# Patient Record
Sex: Male | Born: 1960
Health system: Southern US, Community
[De-identification: ages and names within clinical notes are randomized; demographics above are authoritative.]

---

## 2013-05-08 ENCOUNTER — Encounter (HOSPITAL_COMMUNITY): Payer: Self-pay | Admitting: *Deleted

## 2013-05-08 ENCOUNTER — Emergency Department (HOSPITAL_COMMUNITY)
Admission: EM | Admit: 2013-05-08 | Discharge: 2013-05-09 | Disposition: A | Discharge status: Home / Self Care | Payer: BC Managed Care – PPO | Attending: Emergency Medicine | Admitting: Emergency Medicine

## 2013-05-08 DIAGNOSIS — R071 Chest pain on breathing: Secondary | ICD-10-CM | POA: Insufficient documentation

## 2013-05-08 DIAGNOSIS — R0789 Other chest pain: Secondary | ICD-10-CM

## 2013-05-08 LAB — POCT I-STAT, CHEM 8
BUN: 17 mg/dL (ref 6–23)
Chloride: 104 mEq/L (ref 96–112)
Creatinine, Ser: 1.1 mg/dL (ref 0.50–1.35)
Potassium: 3.4 mEq/L — ABNORMAL LOW (ref 3.5–5.1)
Sodium: 142 mEq/L (ref 135–145)

## 2013-05-08 LAB — CBC
Hemoglobin: 15 g/dL (ref 13.0–17.0)
MCH: 30.5 pg (ref 26.0–34.0)
RBC: 4.91 MIL/uL (ref 4.22–5.81)
WBC: 8.9 10*3/uL (ref 4.0–10.5)

## 2013-05-08 LAB — POCT I-STAT TROPONIN I: Troponin i, poc: 0 ng/mL (ref 0.00–0.08)

## 2013-05-08 NOTE — ED Notes (Addendum)
Chest tightness since this morning while at work, non  Radiating. Reproducible on deep breathing.

## 2013-05-09 ENCOUNTER — Emergency Department (HOSPITAL_COMMUNITY): Payer: BC Managed Care – PPO

## 2013-05-09 LAB — POCT I-STAT TROPONIN I: Troponin i, poc: 0 ng/mL (ref 0.00–0.08)

## 2013-05-09 LAB — D-DIMER, QUANTITATIVE: D-Dimer, Quant: <0.27 ug/mL-FEU (ref 0.00–0.48)

## 2013-05-09 MED ORDER — OXYCODONE-ACETAMINOPHEN 5-325 MG PO TABS
1.0000 | ORAL_TABLET | Freq: Once | ORAL | Status: AC
  Administered 2013-05-09: 1 via ORAL
  Filled 2013-05-09: qty 1

## 2013-05-09 MED ORDER — NAPROXEN 250 MG PO TABS: 250.0000 mg | ORAL_TABLET | Freq: Two times a day (BID) | ORAL | Status: AC

## 2013-05-09 MED ORDER — IBUPROFEN 200 MG PO TABS
400.0000 mg | ORAL_TABLET | Freq: Once | ORAL | Status: AC
  Administered 2013-05-09: 400 mg via ORAL
  Filled 2013-05-09: qty 2

## 2013-05-09 MED ORDER — HYDROCODONE-ACETAMINOPHEN 5-325 MG PO TABS: ORAL_TABLET | ORAL | Status: AC

## 2013-05-09 NOTE — ED Notes (Signed)
Pt transported to XR.  

## 2013-05-09 NOTE — ED Provider Notes (Signed)
History    CSN: [id number] Arrival date & time 05/08/13  2046  First MD Initiated Contact with Patient 05/09/13 0034     Chief Complaint  Patient presents with  . Chest Pain    HPI Pt was seen at 0050.   Per pt, c/o gradual onset and persistence of constant generalized chest "pain" since yesterday morning at 0500.  Describes the pain as "tightness," worsens with deep breathing. States the pain has been constant since its onset. Denies palpitations, no SOB/cough, no abd pain, no N/V/D, no back pain.    History reviewed. No pertinent past medical history.  History reviewed. No pertinent past surgical history.  History  Substance Use Topics  . Smoking status: Never Smoker   . Smokeless tobacco: Not on file  . Alcohol Use: No    Review of Systems ROS: Statement: All systems negative except as marked or noted in the HPI; Constitutional: Negative for fever and chills. ; ; Eyes: Negative for eye pain, redness and discharge. ; ; ENMT: Negative for ear pain, hoarseness, nasal congestion, sinus pressure and sore throat. ; ; Cardiovascular: +CP. Negative for palpitations, diaphoresis, dyspnea and peripheral edema. ; ; Respiratory: Negative for cough, wheezing and stridor. ; ; Gastrointestinal: Negative for nausea, vomiting, diarrhea, abdominal pain, blood in stool, hematemesis, jaundice and rectal bleeding. . ; ; Genitourinary: Negative for dysuria, flank pain and hematuria. ; ; Musculoskeletal: Negative for back pain and neck pain. Negative for swelling and trauma.; ; Skin: Negative for pruritus, rash, abrasions, blisters, bruising and skin lesion.; ; Neuro: Negative for headache, lightheadedness and neck stiffness. Negative for weakness, altered level of consciousness , altered mental status, extremity weakness, paresthesias, involuntary movement, seizure and syncope.       Allergies  Review of patient's allergies indicates no known allergies.  Home Medications  No current outpatient  prescriptions on file. BP 125/86  Pulse 55  Temp(Src) 98.1 F (36.7 C) (Oral)  Resp 17  SpO2 95%  Physical Exam 0055: Physical examination:  Nursing notes reviewed; Vital signs and O2 SAT reviewed;  Constitutional: Well developed, Well nourished, Well hydrated, In no acute distress; Head:  Normocephalic, atraumatic; Eyes: EOMI, PERRL, No scleral icterus; ENMT: Mouth and pharynx normal, Mucous membranes moist; Neck: Supple, Full range of motion, No lymphadenopathy; Cardiovascular: Regular rate and rhythm, No murmur, rub, or gallop; Respiratory: Breath sounds clear & equal bilaterally, No rales, rhonchi, wheezes.  Speaking full sentences with ease, Normal respiratory effort/excursion; Chest: Nontender, Movement normal; Abdomen: Soft, Nontender, Nondistended, Normal bowel sounds; Genitourinary: No CVA tenderness; Extremities: Pulses normal, No tenderness, No edema, No calf edema or asymmetry.; Neuro: AA&Ox3, Major CN grossly intact.  Speech clear. No gross focal motor or sensory deficits in extremities.; Skin: Color normal, Warm, Dry.   ED Course  Procedures     MDM  MDM Reviewed: nursing note and vitals Interpretation: labs, ECG and x-ray    Date: 05/09/2013  Rate: 63  Rhythm: normal sinus rhythm  QRS Axis: normal  Intervals: normal  ST/T Wave abnormalities: normal  Conduction Disutrbances:none  Narrative Interpretation:   Old EKG Reviewed: none available.  Results for orders placed during the hospital encounter of 05/08/13  CBC      Result Value Range   WBC 8.9  4.0 - 10.5 K/uL   RBC 4.91  4.22 - 5.81 MIL/uL   Hemoglobin 15.0  13.0 - 17.0 g/dL   HCT 13.0  86.5 - 78.4 %   MCV 83.3  78.0 - 100.0 fL  MCH 30.5  26.0 - 34.0 pg   MCHC 36.7 (*) 30.0 - 36.0 g/dL   RDW 04.5  40.9 - 81.1 %   Platelets 239  150 - 400 K/uL  D-DIMER, QUANTITATIVE      Result Value Range   D-Dimer, Quant <0.27  0.00 - 0.48 ug/mL-FEU  POCT I-STAT, CHEM 8      Result Value Range   Sodium 142  135 -  145 mEq/L   Potassium 3.4 (*) 3.5 - 5.1 mEq/L   Chloride 104  96 - 112 mEq/L   BUN 17  6 - 23 mg/dL   Creatinine, Ser 9.14  0.50 - 1.35 mg/dL   Glucose, Bld 782 (*) 70 - 99 mg/dL   Calcium, Ion 9.56  2.13 - 1.23 mmol/L   TCO2 25  0 - 100 mmol/L   Hemoglobin 14.3  13.0 - 17.0 g/dL   HCT 08.6  57.8 - 46.9 %  POCT I-STAT TROPONIN I      Result Value Range   Troponin i, poc 0.00  0.00 - 0.08 ng/mL   Comment 3           POCT I-STAT TROPONIN I      Result Value Range   Troponin i, poc 0.00  0.00 - 0.08 ng/mL   Comment 3            Dg Chest 2 View 05/09/2013   *RADIOLOGY REPORT*  Clinical Data: Chest tightness.  Chest pain.  CHEST - 2 VIEW  Comparison: None.  Findings:  Cardiopericardial silhouette within normal limits. Mediastinal contours normal. Trachea midline.  No airspace disease or effusion. Monitoring leads are projected over the chest. No pneumothorax.  IMPRESSION: No active cardiopulmonary disease.   Original Report Authenticated By: Andreas Newport, M.D.    Audio.Aas:  Doubt PE as cause for symptoms with normal d-dimer and low risk [place].  Doubt ACS as cause for symptoms with 2 normal troponins and normal EKG after 20+ hours of constant symptoms, TIMI 0.  Pt states he feels improved after pain meds and wants to go home now. Dx and testing d/w pt and family.  Questions answered.  Verb understanding, agreeable to d/c home with outpt f/u.           Laray Anger, DO 05/11/13 2036

## 2016-06-21 DIAGNOSIS — L255 Unspecified contact dermatitis due to plants, except food: Secondary | ICD-10-CM | POA: Diagnosis not present

## 2016-08-05 DIAGNOSIS — M25552 Pain in left hip: Secondary | ICD-10-CM | POA: Diagnosis not present

## 2016-08-05 DIAGNOSIS — F329 Major depressive disorder, single episode, unspecified: Secondary | ICD-10-CM | POA: Diagnosis not present

## 2016-09-25 DIAGNOSIS — D225 Melanocytic nevi of trunk: Secondary | ICD-10-CM | POA: Diagnosis not present

## 2016-09-25 DIAGNOSIS — L814 Other melanin hyperpigmentation: Secondary | ICD-10-CM | POA: Diagnosis not present

## 2016-09-25 DIAGNOSIS — L820 Inflamed seborrheic keratosis: Secondary | ICD-10-CM | POA: Diagnosis not present

## 2016-09-25 DIAGNOSIS — L821 Other seborrheic keratosis: Secondary | ICD-10-CM | POA: Diagnosis not present

## 2016-10-16 DIAGNOSIS — H01113 Allergic dermatitis of right eye, unspecified eyelid: Secondary | ICD-10-CM | POA: Diagnosis not present

## 2016-11-04 DIAGNOSIS — F321 Major depressive disorder, single episode, moderate: Secondary | ICD-10-CM | POA: Diagnosis not present

## 2016-11-04 DIAGNOSIS — F329 Major depressive disorder, single episode, unspecified: Secondary | ICD-10-CM | POA: Diagnosis not present

## 2016-11-04 DIAGNOSIS — G2581 Restless legs syndrome: Secondary | ICD-10-CM | POA: Diagnosis not present

## 2017-03-23 DIAGNOSIS — L821 Other seborrheic keratosis: Secondary | ICD-10-CM | POA: Diagnosis not present

## 2017-03-23 DIAGNOSIS — L814 Other melanin hyperpigmentation: Secondary | ICD-10-CM | POA: Diagnosis not present

## 2017-03-23 DIAGNOSIS — L255 Unspecified contact dermatitis due to plants, except food: Secondary | ICD-10-CM | POA: Diagnosis not present

## 2017-03-23 DIAGNOSIS — D225 Melanocytic nevi of trunk: Secondary | ICD-10-CM | POA: Diagnosis not present

## 2017-03-23 DIAGNOSIS — L820 Inflamed seborrheic keratosis: Secondary | ICD-10-CM | POA: Diagnosis not present

## 2017-07-09 DIAGNOSIS — G2581 Restless legs syndrome: Secondary | ICD-10-CM | POA: Diagnosis not present

## 2017-07-09 DIAGNOSIS — F329 Major depressive disorder, single episode, unspecified: Secondary | ICD-10-CM | POA: Diagnosis not present

## 2017-12-17 DIAGNOSIS — G2581 Restless legs syndrome: Secondary | ICD-10-CM | POA: Diagnosis not present

## 2017-12-17 DIAGNOSIS — F320 Major depressive disorder, single episode, mild: Secondary | ICD-10-CM | POA: Diagnosis not present

## 2018-05-02 DIAGNOSIS — L729 Follicular cyst of the skin and subcutaneous tissue, unspecified: Secondary | ICD-10-CM | POA: Diagnosis not present

## 2018-05-03 DIAGNOSIS — L720 Epidermal cyst: Secondary | ICD-10-CM | POA: Diagnosis not present

## 2018-05-03 DIAGNOSIS — R208 Other disturbances of skin sensation: Secondary | ICD-10-CM | POA: Diagnosis not present

## 2019-03-13 DIAGNOSIS — H524 Presbyopia: Secondary | ICD-10-CM | POA: Diagnosis not present

## 2019-04-20 DIAGNOSIS — E669 Obesity, unspecified: Secondary | ICD-10-CM | POA: Diagnosis not present

## 2019-04-20 DIAGNOSIS — F320 Major depressive disorder, single episode, mild: Secondary | ICD-10-CM | POA: Diagnosis not present

## 2019-04-20 DIAGNOSIS — G2581 Restless legs syndrome: Secondary | ICD-10-CM | POA: Diagnosis not present

## 2019-04-20 DIAGNOSIS — R0681 Apnea, not elsewhere classified: Secondary | ICD-10-CM | POA: Diagnosis not present

## 2019-05-24 DIAGNOSIS — G2581 Restless legs syndrome: Secondary | ICD-10-CM | POA: Diagnosis not present

## 2019-05-24 DIAGNOSIS — G4730 Sleep apnea, unspecified: Secondary | ICD-10-CM | POA: Diagnosis not present

## 2019-07-20 DIAGNOSIS — G4733 Obstructive sleep apnea (adult) (pediatric): Secondary | ICD-10-CM | POA: Diagnosis not present

## 2019-07-21 ENCOUNTER — Encounter (HOSPITAL_COMMUNITY): Payer: Self-pay

## 2019-07-21 ENCOUNTER — Ambulatory Visit (HOSPITAL_COMMUNITY)
Admission: EM | Admit: 2019-07-21 | Discharge: 2019-07-21 | Disposition: A | Discharge status: Home / Self Care | Payer: 59

## 2019-07-21 ENCOUNTER — Other Ambulatory Visit: Payer: Self-pay

## 2019-07-21 DIAGNOSIS — L237 Allergic contact dermatitis due to plants, except food: Secondary | ICD-10-CM | POA: Diagnosis not present

## 2019-07-21 MED ORDER — METHYLPREDNISOLONE SODIUM SUCC 125 MG IJ SOLR
80.0000 mg | Freq: Once | INTRAMUSCULAR | Status: AC
  Administered 2019-07-21: 80 mg via INTRAMUSCULAR

## 2019-07-21 MED ORDER — METHYLPREDNISOLONE SODIUM SUCC 125 MG IJ SOLR
INTRAMUSCULAR | Status: AC
Start: 2019-07-21 — End: ?
  Filled 2019-07-21: qty 2

## 2019-07-21 MED ORDER — PREDNISONE 10 MG (21) PO TBPK: ORAL_TABLET | ORAL | 0 refills | Status: DC

## 2019-07-21 NOTE — Discharge Instructions (Signed)
Start the prednisone pack tomorrow.  Take with food. Benadryl or zyrtec for itching.  Follow up as needed for continued or worsening symptoms

## 2019-07-21 NOTE — ED Triage Notes (Signed)
Patient presents to Urgent Care with complaints of poison oak rash since 6 days ago. Patient reports it started on his arms and has now spread to his whole body including his face, which is why he came in today. Prednisone has worked in the past, pt has been applying calamine lotion.

## 2019-07-21 NOTE — ED Provider Notes (Signed)
[facility]    CSN: [id number] Arrival date & time: 07/21/19  1300      History   Chief Complaint Chief Complaint  Patient presents with  . Poison Oak    HPI Samuel Aguilar is a 58 y.o. male.   Patient is a 58 year old male the presents today for rash.  The rash started approximately 5 or 6 days ago after doing some yard work and then exposure to poison ivy.  Since the rash is worsened and spreading on bilateral arms, chest and face.  He has been using, lotion.  The rash is somewhat painful and itchy.    Denies any fever, joint pain. Denies any recent changes in lotions, detergents, foods or other possible irritants. No recent travel. Nobody else at home has the rash.  No new foods or medications.   ROS per HPI        History reviewed. No pertinent past medical history.  There are no active problems to display for this patient.   History reviewed. No pertinent surgical history.     Home Medications    Prior to Admission medications   Medication Sig Start Date End Date Taking? Authorizing Provider  escitalopram (LEXAPRO) 20 MG tablet Take by mouth. 05/24/19  Yes [provider]  rOPINIRole (REQUIP) 0.25 MG tablet Take by mouth. 07/09/17 01/29/25 Yes [provider]  HYDROcodone-acetaminophen (NORCO/VICODIN) 5-325 MG per tablet 1 or 2 tabs PO q6 hours prn pain 05/09/13   Francine Graven, DO  naproxen (NAPROSYN) 250 MG tablet Take 1 tablet (250 mg total) by mouth 2 (two) times daily with a meal. 05/09/13   Francine Graven, DO  predniSONE (STERAPRED UNI-PAK 21 TAB) 10 MG (21) TBPK tablet 6 tabs for 1 day, then 5 tabs for 1 das, then 4 tabs for 1 day, then 3 tabs for 1 day, 2 tabs for 1 day, then 1 tab for 1 day 07/21/19   Orvan July, NP    Family History Family History  Problem Relation Age of Onset  . Healthy Mother   . Cancer Father   . Diabetes Father     Social History Social History   Tobacco Use  . Smoking status:  Never Smoker  . Smokeless tobacco: Never Used  Substance Use Topics  . Alcohol use: No  . Drug use: No     Allergies   Patient has no known allergies.   Review of Systems Review of Systems   Physical Exam Triage Vital Signs ED Triage Vitals  Enc Vitals Group     BP 07/21/19 1326 120/71     Pulse Rate 07/21/19 1326 71     Resp 07/21/19 1326 17     Temp 07/21/19 1326 98.3 F (36.8 C)     Temp Source 07/21/19 1326 Oral     SpO2 07/21/19 1326 99 %     Weight --      Height --      Head Circumference --      Peak Flow --      Pain Score 07/21/19 1324 0     Pain Loc --      Pain Edu? --      Excl. in [place]? --    No data found.  Updated Vital Signs BP 120/71 (BP Location: Right Arm)   Pulse 71   Temp 98.3 F (36.8 C) (Oral)   Resp 17   SpO2 99%   Visual Acuity Right Eye Distance:   Left  Eye Distance:   Bilateral Distance:    Right Eye Near:   Left Eye Near:    Bilateral Near:     Physical Exam Vitals signs and nursing note reviewed.  Constitutional:      Appearance: Normal appearance.  HENT:     Head: Normocephalic and atraumatic.     Nose: Nose normal.  Eyes:     Conjunctiva/sclera: Conjunctivae normal.  Neck:     Musculoskeletal: Normal range of motion.  Pulmonary:     Effort: Pulmonary effort is normal.  Musculoskeletal: Normal range of motion.  Skin:    General: Skin is warm and dry.     Findings: Rash present.     Comments: Widespread papulovesicular, erythematous rash to bilateral arms left chest and facial area Some crusting  Neurological:     Mental Status: He is alert.  Psychiatric:        Mood and Affect: Mood normal.      UC Treatments / Results  Labs (all labs ordered are listed, but only abnormal results are displayed) Labs Reviewed - No data to display  EKG   Radiology No results found.  Procedures Procedures (including critical care time)  Medications Ordered in UC Medications  methylPREDNISolone sodium succinate  (SOLU-MEDROL) 125 mg/2 mL injection 80 mg (80 mg Intramuscular Given 07/21/19 1351)  methylPREDNISolone sodium succinate (SOLU-MEDROL) 125 mg/2 mL injection (has no administration in time range)    Initial Impression / Assessment and Plan / UC Course  I have reviewed the triage vital signs and the nursing notes.  Pertinent labs & imaging results that were available during my care of the patient were reviewed by me and considered in my medical decision making (see chart for details).     Rash consistent with poison ivy dermatitis.  Treating with steroid injection here in clinic and sending 6-day taper to start tomorrow. Benadryl or Zyrtec as needed for itching Follow up as needed for continued or worsening symptoms  Final Clinical Impressions(s) / UC Diagnoses   Final diagnoses:  Poison ivy dermatitis     Discharge Instructions     Start the prednisone pack tomorrow.  Take with food. Benadryl or zyrtec for itching.  Follow up as needed for continued or worsening symptoms     ED Prescriptions    Medication Sig Dispense Auth. Provider   predniSONE (STERAPRED UNI-PAK 21 TAB) 10 MG (21) TBPK tablet 6 tabs for 1 day, then 5 tabs for 1 das, then 4 tabs for 1 day, then 3 tabs for 1 day, 2 tabs for 1 day, then 1 tab for 1 day 21 tablet Rozanna Box, Josiephine Simao A, NP     Controlled Substance Prescriptions [place] Controlled Substance Registry consulted? No   Orvan July, NP 07/21/19 1429

## 2019-07-25 DIAGNOSIS — L720 Epidermal cyst: Secondary | ICD-10-CM | POA: Diagnosis not present

## 2019-07-25 DIAGNOSIS — D485 Neoplasm of uncertain behavior of skin: Secondary | ICD-10-CM | POA: Diagnosis not present

## 2019-07-25 DIAGNOSIS — D225 Melanocytic nevi of trunk: Secondary | ICD-10-CM | POA: Diagnosis not present

## 2019-07-25 DIAGNOSIS — L249 Irritant contact dermatitis, unspecified cause: Secondary | ICD-10-CM | POA: Diagnosis not present

## 2019-07-25 DIAGNOSIS — L821 Other seborrheic keratosis: Secondary | ICD-10-CM | POA: Diagnosis not present

## 2019-07-25 DIAGNOSIS — D1801 Hemangioma of skin and subcutaneous tissue: Secondary | ICD-10-CM | POA: Diagnosis not present

## 2019-07-25 DIAGNOSIS — L820 Inflamed seborrheic keratosis: Secondary | ICD-10-CM | POA: Diagnosis not present

## 2019-08-11 DIAGNOSIS — G4733 Obstructive sleep apnea (adult) (pediatric): Secondary | ICD-10-CM | POA: Diagnosis not present

## 2019-09-11 DIAGNOSIS — G4733 Obstructive sleep apnea (adult) (pediatric): Secondary | ICD-10-CM | POA: Diagnosis not present

## 2019-09-12 DIAGNOSIS — L720 Epidermal cyst: Secondary | ICD-10-CM | POA: Diagnosis not present

## 2019-10-11 DIAGNOSIS — G4733 Obstructive sleep apnea (adult) (pediatric): Secondary | ICD-10-CM | POA: Diagnosis not present

## 2020-01-22 DIAGNOSIS — D485 Neoplasm of uncertain behavior of skin: Secondary | ICD-10-CM | POA: Diagnosis not present

## 2020-01-22 DIAGNOSIS — L820 Inflamed seborrheic keratosis: Secondary | ICD-10-CM | POA: Diagnosis not present

## 2020-01-22 DIAGNOSIS — D1801 Hemangioma of skin and subcutaneous tissue: Secondary | ICD-10-CM | POA: Diagnosis not present

## 2020-01-22 DIAGNOSIS — D225 Melanocytic nevi of trunk: Secondary | ICD-10-CM | POA: Diagnosis not present

## 2020-01-29 DIAGNOSIS — G43009 Migraine without aura, not intractable, without status migrainosus: Secondary | ICD-10-CM | POA: Diagnosis not present

## 2020-02-12 DIAGNOSIS — D225 Melanocytic nevi of trunk: Secondary | ICD-10-CM | POA: Diagnosis not present

## 2020-02-12 DIAGNOSIS — D485 Neoplasm of uncertain behavior of skin: Secondary | ICD-10-CM | POA: Diagnosis not present

## 2020-03-29 DIAGNOSIS — M47812 Spondylosis without myelopathy or radiculopathy, cervical region: Secondary | ICD-10-CM | POA: Diagnosis not present

## 2020-03-29 DIAGNOSIS — M542 Cervicalgia: Secondary | ICD-10-CM | POA: Diagnosis not present

## 2020-05-01 ENCOUNTER — Other Ambulatory Visit: Payer: Self-pay | Admitting: Physician Assistant

## 2020-05-01 DIAGNOSIS — M47812 Spondylosis without myelopathy or radiculopathy, cervical region: Secondary | ICD-10-CM | POA: Diagnosis not present

## 2020-05-01 DIAGNOSIS — N289 Disorder of kidney and ureter, unspecified: Secondary | ICD-10-CM | POA: Diagnosis not present

## 2020-05-01 DIAGNOSIS — Z0000 Encounter for general adult medical examination without abnormal findings: Secondary | ICD-10-CM | POA: Diagnosis not present

## 2020-05-02 DIAGNOSIS — M5382 Other specified dorsopathies, cervical region: Secondary | ICD-10-CM | POA: Diagnosis not present

## 2020-05-02 DIAGNOSIS — R29898 Other symptoms and signs involving the musculoskeletal system: Secondary | ICD-10-CM | POA: Diagnosis not present

## 2020-05-02 DIAGNOSIS — M47812 Spondylosis without myelopathy or radiculopathy, cervical region: Secondary | ICD-10-CM | POA: Diagnosis not present

## 2020-05-03 ENCOUNTER — Other Ambulatory Visit: Payer: Self-pay | Admitting: Physician Assistant

## 2020-05-06 DIAGNOSIS — R29898 Other symptoms and signs involving the musculoskeletal system: Secondary | ICD-10-CM | POA: Diagnosis not present

## 2020-05-06 DIAGNOSIS — M5382 Other specified dorsopathies, cervical region: Secondary | ICD-10-CM | POA: Diagnosis not present

## 2020-05-06 DIAGNOSIS — M47812 Spondylosis without myelopathy or radiculopathy, cervical region: Secondary | ICD-10-CM | POA: Diagnosis not present

## 2020-05-10 DIAGNOSIS — G4733 Obstructive sleep apnea (adult) (pediatric): Secondary | ICD-10-CM | POA: Diagnosis not present

## 2020-05-23 DIAGNOSIS — M47812 Spondylosis without myelopathy or radiculopathy, cervical region: Secondary | ICD-10-CM | POA: Diagnosis not present

## 2020-05-23 DIAGNOSIS — M5382 Other specified dorsopathies, cervical region: Secondary | ICD-10-CM | POA: Diagnosis not present

## 2020-05-23 DIAGNOSIS — R29898 Other symptoms and signs involving the musculoskeletal system: Secondary | ICD-10-CM | POA: Diagnosis not present

## 2020-05-28 ENCOUNTER — Other Ambulatory Visit: Payer: Self-pay | Admitting: Physician Assistant

## 2020-05-30 DIAGNOSIS — R29898 Other symptoms and signs involving the musculoskeletal system: Secondary | ICD-10-CM | POA: Diagnosis not present

## 2020-05-30 DIAGNOSIS — M5382 Other specified dorsopathies, cervical region: Secondary | ICD-10-CM | POA: Diagnosis not present

## 2020-05-30 DIAGNOSIS — M47812 Spondylosis without myelopathy or radiculopathy, cervical region: Secondary | ICD-10-CM | POA: Diagnosis not present

## 2020-07-18 DIAGNOSIS — Z20822 Contact with and (suspected) exposure to covid-19: Secondary | ICD-10-CM | POA: Diagnosis not present

## 2020-10-10 ENCOUNTER — Other Ambulatory Visit: Payer: Self-pay | Admitting: Physician Assistant

## 2020-10-19 DIAGNOSIS — Z20822 Contact with and (suspected) exposure to covid-19: Secondary | ICD-10-CM | POA: Diagnosis not present

## 2020-10-28 ENCOUNTER — Other Ambulatory Visit: Payer: Self-pay | Admitting: Physician Assistant

## 2021-02-04 ENCOUNTER — Other Ambulatory Visit: Payer: Self-pay

## 2021-02-18 ENCOUNTER — Other Ambulatory Visit: Payer: Self-pay

## 2021-02-18 MED FILL — Rosuvastatin Calcium Tab 10 MG: ORAL | 90 days supply | Qty: 90 | Fill #0 | Status: AC

## 2021-02-18 MED FILL — Escitalopram Oxalate Tab 20 MG (Base Equiv): ORAL | 90 days supply | Qty: 90 | Fill #0 | Status: AC

## 2021-02-19 ENCOUNTER — Other Ambulatory Visit: Payer: Self-pay

## 2021-02-20 ENCOUNTER — Other Ambulatory Visit: Payer: Self-pay

## 2021-02-21 ENCOUNTER — Other Ambulatory Visit: Payer: Self-pay

## 2021-02-24 ENCOUNTER — Other Ambulatory Visit: Payer: Self-pay

## 2021-02-24 MED ORDER — ROPINIROLE HCL 0.5 MG PO TABS
0.5000 mg | ORAL_TABLET | Freq: Every day | ORAL | 3 refills | Status: AC
  Filled 2021-02-24: qty 30, 30d supply, fill #0
  Filled 2021-03-26: qty 30, 30d supply, fill #1
  Filled 2021-04-28: qty 30, 30d supply, fill #2
  Filled 2021-05-25: qty 30, 30d supply, fill #3

## 2021-03-11 DIAGNOSIS — L814 Other melanin hyperpigmentation: Secondary | ICD-10-CM | POA: Diagnosis not present

## 2021-03-11 DIAGNOSIS — D1801 Hemangioma of skin and subcutaneous tissue: Secondary | ICD-10-CM | POA: Diagnosis not present

## 2021-03-11 DIAGNOSIS — D2262 Melanocytic nevi of left upper limb, including shoulder: Secondary | ICD-10-CM | POA: Diagnosis not present

## 2021-03-11 DIAGNOSIS — D2221 Melanocytic nevi of right ear and external auricular canal: Secondary | ICD-10-CM | POA: Diagnosis not present

## 2021-03-11 DIAGNOSIS — L821 Other seborrheic keratosis: Secondary | ICD-10-CM | POA: Diagnosis not present

## 2021-03-11 DIAGNOSIS — D229 Melanocytic nevi, unspecified: Secondary | ICD-10-CM | POA: Diagnosis not present

## 2021-03-11 DIAGNOSIS — D485 Neoplasm of uncertain behavior of skin: Secondary | ICD-10-CM | POA: Diagnosis not present

## 2021-03-11 DIAGNOSIS — L820 Inflamed seborrheic keratosis: Secondary | ICD-10-CM | POA: Diagnosis not present

## 2021-03-27 ENCOUNTER — Other Ambulatory Visit: Payer: Self-pay

## 2021-04-02 DIAGNOSIS — H524 Presbyopia: Secondary | ICD-10-CM | POA: Diagnosis not present

## 2021-04-28 ENCOUNTER — Other Ambulatory Visit: Payer: Self-pay

## 2021-04-29 ENCOUNTER — Other Ambulatory Visit: Payer: Self-pay

## 2021-05-02 ENCOUNTER — Other Ambulatory Visit: Payer: Self-pay

## 2021-05-14 ENCOUNTER — Other Ambulatory Visit: Payer: Self-pay

## 2021-05-16 ENCOUNTER — Other Ambulatory Visit: Payer: Self-pay

## 2021-05-19 ENCOUNTER — Other Ambulatory Visit: Payer: Self-pay

## 2021-05-19 MED ORDER — ESCITALOPRAM OXALATE 20 MG PO TABS
20.0000 mg | ORAL_TABLET | Freq: Every day | ORAL | 3 refills | Status: AC
  Filled 2021-05-19: qty 90, 90d supply, fill #0
  Filled 2021-09-01: qty 90, 90d supply, fill #1
  Filled 2021-12-17: qty 90, 90d supply, fill #2
  Filled 2022-04-08: qty 90, 90d supply, fill #3

## 2021-05-26 ENCOUNTER — Other Ambulatory Visit: Payer: Self-pay

## 2021-07-01 ENCOUNTER — Other Ambulatory Visit: Payer: Self-pay

## 2021-07-02 ENCOUNTER — Other Ambulatory Visit: Payer: Self-pay

## 2021-07-03 ENCOUNTER — Other Ambulatory Visit: Payer: Self-pay

## 2021-07-06 ENCOUNTER — Ambulatory Visit (HOSPITAL_COMMUNITY)
Admission: EM | Admit: 2021-07-06 | Discharge: 2021-07-06 | Disposition: A | Discharge status: Home / Self Care | Payer: 59 | Attending: Student | Admitting: Student

## 2021-07-06 ENCOUNTER — Other Ambulatory Visit: Payer: Self-pay

## 2021-07-06 ENCOUNTER — Ambulatory Visit (INDEPENDENT_AMBULATORY_CARE_PROVIDER_SITE_OTHER): Payer: 59

## 2021-07-06 ENCOUNTER — Encounter (HOSPITAL_COMMUNITY): Payer: Self-pay | Admitting: Emergency Medicine

## 2021-07-06 DIAGNOSIS — J209 Acute bronchitis, unspecified: Secondary | ICD-10-CM | POA: Diagnosis not present

## 2021-07-06 DIAGNOSIS — R0602 Shortness of breath: Secondary | ICD-10-CM | POA: Diagnosis not present

## 2021-07-06 DIAGNOSIS — R509 Fever, unspecified: Secondary | ICD-10-CM | POA: Diagnosis not present

## 2021-07-06 DIAGNOSIS — R059 Cough, unspecified: Secondary | ICD-10-CM | POA: Diagnosis not present

## 2021-07-06 MED ORDER — PROMETHAZINE-DM 6.25-15 MG/5ML PO SYRP: 5.0000 mL | ORAL_SOLUTION | Freq: Four times a day (QID) | ORAL | 0 refills | Status: AC | PRN

## 2021-07-06 MED ORDER — ALBUTEROL SULFATE HFA 108 (90 BASE) MCG/ACT IN AERS: 1.0000 | INHALATION_SPRAY | Freq: Four times a day (QID) | RESPIRATORY_TRACT | 0 refills | Status: AC | PRN

## 2021-07-06 MED ORDER — PREDNISONE 10 MG (21) PO TBPK: ORAL_TABLET | Freq: Every day | ORAL | 0 refills | Status: AC

## 2021-07-06 MED ORDER — AMOXICILLIN-POT CLAVULANATE 875-125 MG PO TABS: 1.0000 | ORAL_TABLET | Freq: Two times a day (BID) | ORAL | 0 refills | Status: AC

## 2021-07-06 NOTE — ED Triage Notes (Signed)
PT reports cough and "head cold" for 3 weeks that has continued with intermittent fevers and shortness of breath. His granddaughter has the same symptoms and was diagnosed with pneumonia yesterday. PT was covid negative earlier in disease course.

## 2021-07-06 NOTE — Discharge Instructions (Addendum)
-  Start the antibiotic-Augmentin (amoxicillin-clavulanate), 1 pill every 12 hours for 7 days.  You can take this with food like with breakfast and dinner. -Prednisone taper for cough/bronchitis. I recommend taking this in the morning as it could give you energy.  Avoid NSAIDs like ibuprofen and alleve while taking this medication as they can increase your risk of stomach upset and even GI bleeding when in combination with a steroid. You can continue tylenol (acetaminophen) up to '1000mg'$  3x daily. -Promethazine DM cough syrup for congestion/cough. This could make you drowsy, so take at night before bed. -Albuterol inhaler as needed for cough, wheezing, shortness of breath, 1 to 2 puffs every 6 hours as needed. -Come back and see [place] if your symptoms are getting worse instead of better, like worsening shortness of breath, new chest pain, new dizziness, new weakness, etc.

## 2021-07-06 NOTE — ED Provider Notes (Signed)
[place]    CSN: [id number] Arrival date & time: 07/06/21  1009      History   Chief Complaint Chief Complaint  Patient presents with   Cough    HPI Samuel Aguilar is a 60 y.o. male presenting with cough for about 3 weeks.  Medical history noncontributory.  Denies history of pulmonary disease.  States he initially developed a head cold about 3 weeks ago but this resolved, but again endorses about 5 days of coughing, productive of yellow sputum.  Shortness of breath constantly, but worse with exertion.  Malaise.  Fevers as high as 101.2 one day ago, last antipyretic was 6 hours ago.  States his granddaughter actually was diagnosed with pneumonia 1 day ago, and he is concerned for this.  Denies chest pain, dizziness.  HPI  History reviewed. No pertinent past medical history.  There are no problems to display for this patient.   History reviewed. No pertinent surgical history.     Home Medications    Prior to Admission medications   Medication Sig Start Date End Date Taking? Authorizing Provider  albuterol (VENTOLIN HFA) 108 (90 Base) MCG/ACT inhaler Inhale 1-2 puffs into the lungs every 6 (six) hours as needed for wheezing or shortness of breath. 07/06/21  Yes Hazel Sams, PA-C  amoxicillin-clavulanate (AUGMENTIN) 875-125 MG tablet Take 1 tablet by mouth every 12 (twelve) hours. 07/06/21  Yes Hazel Sams, PA-C  escitalopram (LEXAPRO) 20 MG tablet TAKE 1 TABLET BY MOUTH DAILY. 05/19/21  Yes   predniSONE (STERAPRED UNI-PAK 21 TAB) 10 MG (21) TBPK tablet Take by mouth daily. Take 6 tabs by mouth daily  for 2 days, then 5 tabs for 2 days, then 4 tabs for 2 days, then 3 tabs for 2 days, 2 tabs for 2 days, then 1 tab by mouth daily for 2 days 07/06/21  Yes Phillip Heal, Sherlon Handing, PA-C  promethazine-dextromethorphan (PROMETHAZINE-DM) 6.25-15 MG/5ML syrup Take 5 mLs by mouth 4 (four) times daily as needed for cough. 07/06/21  Yes Hazel Sams, PA-C  rOPINIRole  (REQUIP) 0.5 MG tablet TAKE 1 TABLET BY MOUTH AT BEDTIME. 02/24/21  Yes   escitalopram (LEXAPRO) 20 MG tablet Take by mouth. 05/24/19   [provider]  HYDROcodone-acetaminophen (NORCO/VICODIN) 5-325 MG per tablet 1 or 2 tabs PO q6 hours prn pain 05/09/13   Francine Graven, DO  naproxen (NAPROSYN) 250 MG tablet Take 1 tablet (250 mg total) by mouth 2 (two) times daily with a meal. 05/09/13   Francine Graven, DO  rOPINIRole (REQUIP) 0.25 MG tablet Take by mouth. 07/09/17 01/29/25  [provider]  rOPINIRole (REQUIP) 0.5 MG tablet TAKE 1 TABLET BY MOUTH AT BEDTIME. 05/01/20 05/01/21  Podraza, Sindy Messing, PA-C  rosuvastatin (CRESTOR) 10 MG tablet TAKE 1 TABLET BY MOUTH ONCE DAILY AT BEDTIME. 05/03/20 05/19/21  Podraza, Sindy Messing, PA-C  Vitamin D, Ergocalciferol, (DRISDOL) 1.25 MG (50000 UNIT) CAPS capsule TAKE 1 CAPSULE BY MOUTH ONCE A WEEK. 10/28/20 10/28/21  Podraza, Sindy Messing, PA-C    Family History Family History  Problem Relation Age of Onset   Healthy Mother    Cancer Father    Diabetes Father     Social History Social History   Tobacco Use   Smoking status: Never   Smokeless tobacco: Never  Vaping Use   Vaping Use: Never used  Substance Use Topics   Alcohol use: No   Drug use: No     Allergies   Patient has no  known allergies.   Review of Systems Review of Systems  Constitutional:  Negative for appetite change, chills and fever.  HENT:  Positive for congestion. Negative for ear pain, rhinorrhea, sinus pressure, sinus pain and sore throat.   Eyes:  Negative for redness and visual disturbance.  Respiratory:  Positive for cough and shortness of breath. Negative for chest tightness and wheezing.   Cardiovascular:  Negative for chest pain and palpitations.  Gastrointestinal:  Negative for abdominal pain, constipation, diarrhea, nausea and vomiting.  Genitourinary:  Negative for dysuria, frequency and urgency.  Musculoskeletal:  Negative for  myalgias.  Neurological:  Negative for dizziness, weakness and headaches.  Psychiatric/Behavioral:  Negative for confusion.   All other systems reviewed and are negative.   Physical Exam Triage Vital Signs ED Triage Vitals  Enc Vitals Group     BP 07/06/21 1103 114/82     Pulse Rate 07/06/21 1103 60     Resp 07/06/21 1103 16     Temp 07/06/21 1103 98.5 F (36.9 C)     Temp Source 07/06/21 1103 Oral     SpO2 07/06/21 1103 96 %     Weight --      Height --      Head Circumference --      Peak Flow --      Pain Score 07/06/21 1059 0     Pain Loc --      Pain Edu? --      Excl. in [place]? --    No data found.  Updated Vital Signs BP 114/82   Pulse 60   Temp 98.5 F (36.9 C) (Oral)   Resp 16   SpO2 96%   Visual Acuity Right Eye Distance:   Left Eye Distance:   Bilateral Distance:    Right Eye Near:   Left Eye Near:    Bilateral Near:     Physical Exam Vitals reviewed.  Constitutional:      General: He is not in acute distress.    Appearance: Normal appearance. He is ill-appearing.  HENT:     Head: Normocephalic and atraumatic.     Right Ear: Tympanic membrane, ear canal and external ear normal. No tenderness. No middle ear effusion. There is no impacted cerumen. Tympanic membrane is not perforated, erythematous, retracted or bulging.     Left Ear: Tympanic membrane, ear canal and external ear normal. No tenderness.  No middle ear effusion. There is no impacted cerumen. Tympanic membrane is not perforated, erythematous, retracted or bulging.     Nose: Nose normal. No congestion.     Mouth/Throat:     Mouth: Mucous membranes are moist.     Pharynx: Uvula midline. No oropharyngeal exudate or posterior oropharyngeal erythema.  Eyes:     Extraocular Movements: Extraocular movements intact.     Pupils: Pupils are equal, round, and reactive to light.  Cardiovascular:     Rate and Rhythm: Normal rate and regular rhythm.     Heart sounds: Normal heart sounds.  Pulmonary:      Effort: Pulmonary effort is normal. No tachypnea, bradypnea, accessory muscle usage, prolonged expiration or respiratory distress.     Breath sounds: Examination of the left-middle field reveals wheezing and rhonchi. Wheezing and rhonchi present. No decreased breath sounds or rales.  Abdominal:     Palpations: Abdomen is soft.     Tenderness: There is no abdominal tenderness. There is no guarding or rebound.  Neurological:     General: No focal deficit present.  Mental Status: He is alert and oriented to person, place, and time.  Psychiatric:        Mood and Affect: Mood normal.        Behavior: Behavior normal.        Thought Content: Thought content normal.        Judgment: Judgment normal.     UC Treatments / Results  Labs (all labs ordered are listed, but only abnormal results are displayed) Labs Reviewed - No data to display   EKG   Radiology DG Chest 2 View  Result Date: 07/06/2021 CLINICAL DATA:  Cough for 3 weeks, intermittent fevers and shortness of breath. EXAM: CHEST - 2 VIEW COMPARISON:  Chest x-ray dated 05/09/2013. FINDINGS: Heart size and mediastinal contours are within normal limits. Lungs are clear. No pleural effusion or pneumothorax is seen. Osseous structures about the chest are unremarkable. IMPRESSION: No active cardiopulmonary disease. No evidence of pneumonia or pulmonary edema. Electronically Signed   By: Franki Cabot M.D.   On: 07/06/2021 11:36    Procedures Procedures (including critical care time)  Medications Ordered in UC Medications - No data to display  Initial Impression / Assessment and Plan / UC Course  I have reviewed the triage vital signs and the nursing notes.  Pertinent labs & imaging results that were available during my care of the patient were reviewed by me and considered in my medical decision making (see chart for details).     This patient is a very pleasant 60 y.o. year old male presenting with acute bronchitis  following viral URI. Today this pt is afebrile nontachycardic nontachypneic, oxygenating well on room air,few rhonchi middle lung fields. Last antipyretic 6 hours ago.  Negative home test for COVID-19, declines additional testing.   CXR - No active cardiopulmonary disease. No evidence of pneumonia or pulmonary edema.  Given fevers/chills, worsening shortness of breath, I do have concern for early pneumonia despite CXR. Will cover with augmentin, prednisone taper, promethazine DM, and albuterol inhaler.  ED return precautions discussed. Patient verbalizes understanding and agreement.   Coding Level 4 for acute illness with systemic symptoms, and prescription drug management   Final Clinical Impressions(s) / UC Diagnoses   Final diagnoses:  Acute bronchitis, unspecified organism     Discharge Instructions      -Start the antibiotic-Augmentin (amoxicillin-clavulanate), 1 pill every 12 hours for 7 days.  You can take this with food like with breakfast and dinner. -Prednisone taper for cough/bronchitis. I recommend taking this in the morning as it could give you energy.  Avoid NSAIDs like ibuprofen and alleve while taking this medication as they can increase your risk of stomach upset and even GI bleeding when in combination with a steroid. You can continue tylenol (acetaminophen) up to '1000mg'$  3x daily. -Promethazine DM cough syrup for congestion/cough. This could make you drowsy, so take at night before bed. -Albuterol inhaler as needed for cough, wheezing, shortness of breath, 1 to 2 puffs every 6 hours as needed. -Come back and see [place] if your symptoms are getting worse instead of better, like worsening shortness of breath, new chest pain, new dizziness, new weakness, etc.     ED Prescriptions     Medication Sig Dispense Auth. Provider   amoxicillin-clavulanate (AUGMENTIN) 875-125 MG tablet Take 1 tablet by mouth every 12 (twelve) hours. 14 tablet Hazel Sams, PA-C   predniSONE  (STERAPRED UNI-PAK 21 TAB) 10 MG (21) TBPK tablet Take by mouth daily. Take 6 tabs by mouth  daily  for 2 days, then 5 tabs for 2 days, then 4 tabs for 2 days, then 3 tabs for 2 days, 2 tabs for 2 days, then 1 tab by mouth daily for 2 days 42 tablet Hazel Sams, PA-C   albuterol (VENTOLIN HFA) 108 (90 Base) MCG/ACT inhaler Inhale 1-2 puffs into the lungs every 6 (six) hours as needed for wheezing or shortness of breath. 1 each Hazel Sams, PA-C   promethazine-dextromethorphan (PROMETHAZINE-DM) 6.25-15 MG/5ML syrup Take 5 mLs by mouth 4 (four) times daily as needed for cough. 118 mL Hazel Sams, PA-C      PDMP not reviewed this encounter.   Hazel Sams, PA-C 07/06/21 1150

## 2021-07-10 ENCOUNTER — Other Ambulatory Visit: Payer: Self-pay

## 2021-07-16 ENCOUNTER — Ambulatory Visit (HOSPITAL_COMMUNITY)
Admission: EM | Admit: 2021-07-16 | Discharge: 2021-07-16 | Disposition: A | Discharge status: Home / Self Care | Payer: 59 | Attending: Family Medicine | Admitting: Family Medicine

## 2021-07-16 ENCOUNTER — Encounter (HOSPITAL_COMMUNITY): Payer: Self-pay | Admitting: Emergency Medicine

## 2021-07-16 DIAGNOSIS — J029 Acute pharyngitis, unspecified: Secondary | ICD-10-CM | POA: Diagnosis not present

## 2021-07-16 DIAGNOSIS — H65192 Other acute nonsuppurative otitis media, left ear: Secondary | ICD-10-CM | POA: Diagnosis not present

## 2021-07-16 MED ORDER — LIDOCAINE VISCOUS HCL 2 % MT SOLN: 20.0000 mL | OROMUCOSAL | 0 refills | Status: AC | PRN

## 2021-07-16 MED ORDER — FLUTICASONE PROPIONATE 50 MCG/ACT NA SUSP: 1.0000 | Freq: Two times a day (BID) | NASAL | 0 refills | Status: AC

## 2021-07-16 MED ORDER — PREDNISONE 20 MG PO TABS: 40.0000 mg | ORAL_TABLET | Freq: Every day | ORAL | 0 refills | Status: AC

## 2021-07-16 NOTE — ED Triage Notes (Signed)
Pt is present today with a sore throat and left ear pain. Pt states that his sx started for x1 week.

## 2021-07-19 NOTE — ED Provider Notes (Signed)
[place]    CSN: [id number] Arrival date & time: 07/16/21  1909      History   Chief Complaint Chief Complaint  Patient presents with   Sore Throat   Otalgia    HPI Samuel Aguilar is a 60 y.o. male.   Pt is present today with a sore throat and left ear pain. Pt states that his sx started for x1 week.   Patient presenting today with 1 week history of sore throat, left ear pain.  He denies chest pain, shortness of breath, cough, congestion, fever, chills, abdominal pain, nausea vomiting or diarrhea.  States he has been sick off and on for the past 6 to 8 weeks.  Was seen about a week ago and diagnosed with bronchitis, treated with prednisone, amoxicillin, cough medicine.  He states he did improve somewhat but his sore throat and ear pain are persisting.  Not currently trying anything over-the-counter for symptoms.  No known history of seasonal allergies per patient.   History reviewed. No pertinent past medical history.  There are no problems to display for this patient.   History reviewed. No pertinent surgical history.     Home Medications    Prior to Admission medications   Medication Sig Start Date End Date Taking? Authorizing Provider  fluticasone (FLONASE) 50 MCG/ACT nasal spray Place 1 spray into both nostrils 2 (two) times daily. 07/16/21  Yes Volney American, PA-C  lidocaine (XYLOCAINE) 2 % solution Use as directed 20 mLs in the mouth or throat as needed for mouth pain. 07/16/21  Yes Volney American, PA-C  predniSONE (DELTASONE) 20 MG tablet Take 2 tablets (40 mg total) by mouth daily with breakfast. 07/16/21  Yes Volney American, PA-C  albuterol (VENTOLIN HFA) 108 (90 Base) MCG/ACT inhaler Inhale 1-2 puffs into the lungs every 6 (six) hours as needed for wheezing or shortness of breath. 07/06/21   Hazel Sams, PA-C  amoxicillin-clavulanate (AUGMENTIN) 875-125 MG tablet Take 1 tablet by mouth every 12 (twelve) hours. 07/06/21    Hazel Sams, PA-C  escitalopram (LEXAPRO) 20 MG tablet Take by mouth. 05/24/19   [provider]  escitalopram (LEXAPRO) 20 MG tablet TAKE 1 TABLET BY MOUTH DAILY. 05/19/21     HYDROcodone-acetaminophen (NORCO/VICODIN) 5-325 MG per tablet 1 or 2 tabs PO q6 hours prn pain 05/09/13   Francine Graven, DO  naproxen (NAPROSYN) 250 MG tablet Take 1 tablet (250 mg total) by mouth 2 (two) times daily with a meal. 05/09/13   Francine Graven, DO  predniSONE (STERAPRED UNI-PAK 21 TAB) 10 MG (21) TBPK tablet Take by mouth daily. Take 6 tabs by mouth daily  for 2 days, then 5 tabs for 2 days, then 4 tabs for 2 days, then 3 tabs for 2 days, 2 tabs for 2 days, then 1 tab by mouth daily for 2 days 07/06/21   Hazel Sams, PA-C  promethazine-dextromethorphan (PROMETHAZINE-DM) 6.25-15 MG/5ML syrup Take 5 mLs by mouth 4 (four) times daily as needed for cough. 07/06/21   Hazel Sams, PA-C  rOPINIRole (REQUIP) 0.25 MG tablet Take by mouth. 07/09/17 01/29/25  [provider]  rOPINIRole (REQUIP) 0.5 MG tablet TAKE 1 TABLET BY MOUTH AT BEDTIME. 02/24/21     rOPINIRole (REQUIP) 0.5 MG tablet TAKE 1 TABLET BY MOUTH AT BEDTIME. 05/01/20 05/01/21  Podraza, Sindy Messing, PA-C  rosuvastatin (CRESTOR) 10 MG tablet TAKE 1 TABLET BY MOUTH ONCE DAILY AT BEDTIME. 05/03/20 05/19/21  Podraza, Sindy Messing, PA-C  Vitamin D, Ergocalciferol, (DRISDOL) 1.25 MG (50000 UNIT) CAPS capsule TAKE 1 CAPSULE BY MOUTH ONCE A WEEK. 10/28/20 10/28/21  Podraza, Sindy Messing, PA-C    Family History Family History  Problem Relation Age of Onset   Healthy Mother    Cancer Father    Diabetes Father     Social History Social History   Tobacco Use   Smoking status: Never   Smokeless tobacco: Never  Vaping Use   Vaping Use: Never used  Substance Use Topics   Alcohol use: No   Drug use: No     Allergies   Patient has no known allergies.   Review of Systems Review of Systems Per HPI  Physical  Exam Triage Vital Signs ED Triage Vitals  Enc Vitals Group     BP 07/16/21 1931 128/83     Pulse Rate 07/16/21 1931 62     Resp 07/16/21 1931 18     Temp 07/16/21 1931 98 F (36.7 C)     Temp src --      SpO2 07/16/21 1931 98 %     Weight --      Height --      Head Circumference --      Peak Flow --      Pain Score 07/16/21 1929 8     Pain Loc --      Pain Edu? --      Excl. in [place]? --    No data found.  Updated Vital Signs BP 128/83   Pulse 62   Temp 98 F (36.7 C)   Resp 18   SpO2 98%   Visual Acuity Right Eye Distance:   Left Eye Distance:   Bilateral Distance:    Right Eye Near:   Left Eye Near:    Bilateral Near:     Physical Exam Vitals and nursing note reviewed.  Constitutional:      Appearance: Normal appearance.  HENT:     Head: Atraumatic.     Ears:     Comments: Mild bilateral middle ear effusions    Nose:     Comments: Nasal mucosa boggy and erythematous    Mouth/Throat:     Mouth: Mucous membranes are moist.     Pharynx: Posterior oropharyngeal erythema present. No oropharyngeal exudate.  Eyes:     Extraocular Movements: Extraocular movements intact.     Conjunctiva/sclera: Conjunctivae normal.  Cardiovascular:     Rate and Rhythm: Normal rate and regular rhythm.  Pulmonary:     Effort: Pulmonary effort is normal.     Breath sounds: Normal breath sounds. No wheezing or rales.  Abdominal:     General: Bowel sounds are normal. There is no distension.     Palpations: Abdomen is soft.     Tenderness: There is no abdominal tenderness. There is no guarding.  Musculoskeletal:        General: Normal range of motion.     Cervical back: Normal range of motion and neck supple.  Skin:    General: Skin is warm and dry.  Neurological:     General: No focal deficit present.     Mental Status: He is oriented to person, place, and time.  Psychiatric:        Mood and Affect: Mood normal.        Thought Content: Thought content normal.         Judgment: Judgment normal.     UC Treatments / Results  Labs (all labs  ordered are listed, but only abnormal results are displayed) Labs Reviewed - No data to display  EKG   Radiology No results found.  Procedures Procedures (including critical care time)  Medications Ordered in UC Medications - No data to display  Initial Impression / Assessment and Plan / UC Course  I have reviewed the triage vital signs and the nursing notes.  Pertinent labs & imaging results that were available during my care of the patient were reviewed by me and considered in my medical decision making (see chart for details).     Suspect inflammatory cause of lingering symptoms.  Possibly allergic component so discussed getting on some allergy medications to see if this helps given the chronicity of his symptoms.  We will also treat with another prednisone burst, viscous lidocaine, Flonase additionally help with symptoms.  No evidence of bacterial component today, reassurance given.  Return for acutely worsening symptoms. Final Clinical Impressions(s) / UC Diagnoses   Final diagnoses:  Acute pharyngitis, unspecified etiology  Acute middle ear effusion, left   Discharge Instructions   None    ED Prescriptions     Medication Sig Dispense Auth. Provider   predniSONE (DELTASONE) 20 MG tablet Take 2 tablets (40 mg total) by mouth daily with breakfast. 10 tablet Volney American, PA-C   lidocaine (XYLOCAINE) 2 % solution Use as directed 20 mLs in the mouth or throat as needed for mouth pain. 100 mL Volney American, PA-C   fluticasone [facility]) 50 MCG/ACT nasal spray Place 1 spray into both nostrils 2 (two) times daily. 16 g Volney American, [place]      PDMP not reviewed this encounter.   Volney American, [place] 07/19/21 1742

## 2021-09-01 ENCOUNTER — Other Ambulatory Visit: Payer: Self-pay

## 2021-09-02 ENCOUNTER — Other Ambulatory Visit: Payer: Self-pay

## 2021-12-18 ENCOUNTER — Other Ambulatory Visit: Payer: Self-pay

## 2022-04-08 ENCOUNTER — Other Ambulatory Visit: Payer: Self-pay

## 2022-07-30 ENCOUNTER — Other Ambulatory Visit: Payer: Self-pay

## 2022-07-30 DIAGNOSIS — F320 Major depressive disorder, single episode, mild: Secondary | ICD-10-CM | POA: Diagnosis not present

## 2022-07-30 DIAGNOSIS — F321 Major depressive disorder, single episode, moderate: Secondary | ICD-10-CM | POA: Diagnosis not present

## 2022-07-30 MED ORDER — BUPROPION HCL ER (XL) 150 MG PO TB24
150.0000 mg | ORAL_TABLET | Freq: Every morning | ORAL | 3 refills | Status: AC
  Filled 2022-07-30: qty 90, 90d supply, fill #0

## 2022-07-30 MED ORDER — ESCITALOPRAM OXALATE 20 MG PO TABS
20.0000 mg | ORAL_TABLET | Freq: Every day | ORAL | 3 refills | Status: AC
  Filled 2022-07-30: qty 90, 90d supply, fill #0

## 2022-07-31 ENCOUNTER — Other Ambulatory Visit: Payer: Self-pay

## 2022-08-03 ENCOUNTER — Other Ambulatory Visit: Payer: Self-pay

## 2022-09-23 DIAGNOSIS — L239 Allergic contact dermatitis, unspecified cause: Secondary | ICD-10-CM | POA: Diagnosis not present

## 2022-09-23 DIAGNOSIS — L821 Other seborrheic keratosis: Secondary | ICD-10-CM | POA: Diagnosis not present

## 2022-09-23 DIAGNOSIS — L814 Other melanin hyperpigmentation: Secondary | ICD-10-CM | POA: Diagnosis not present

## 2022-09-23 DIAGNOSIS — D225 Melanocytic nevi of trunk: Secondary | ICD-10-CM | POA: Diagnosis not present

## 2023-02-10 DIAGNOSIS — L255 Unspecified contact dermatitis due to plants, except food: Secondary | ICD-10-CM | POA: Diagnosis not present

## 2023-08-25 IMAGING — DX DG CHEST 2V
2 series · 2 of 2 positions shown · non-contrast
Comparison: Chest x-ray dated 05/09/2013.

CLINICAL DATA: Cough for 3 weeks, intermittent fevers and shortness
of breath.

EXAM:
CHEST - 2 VIEW

[chest pa]
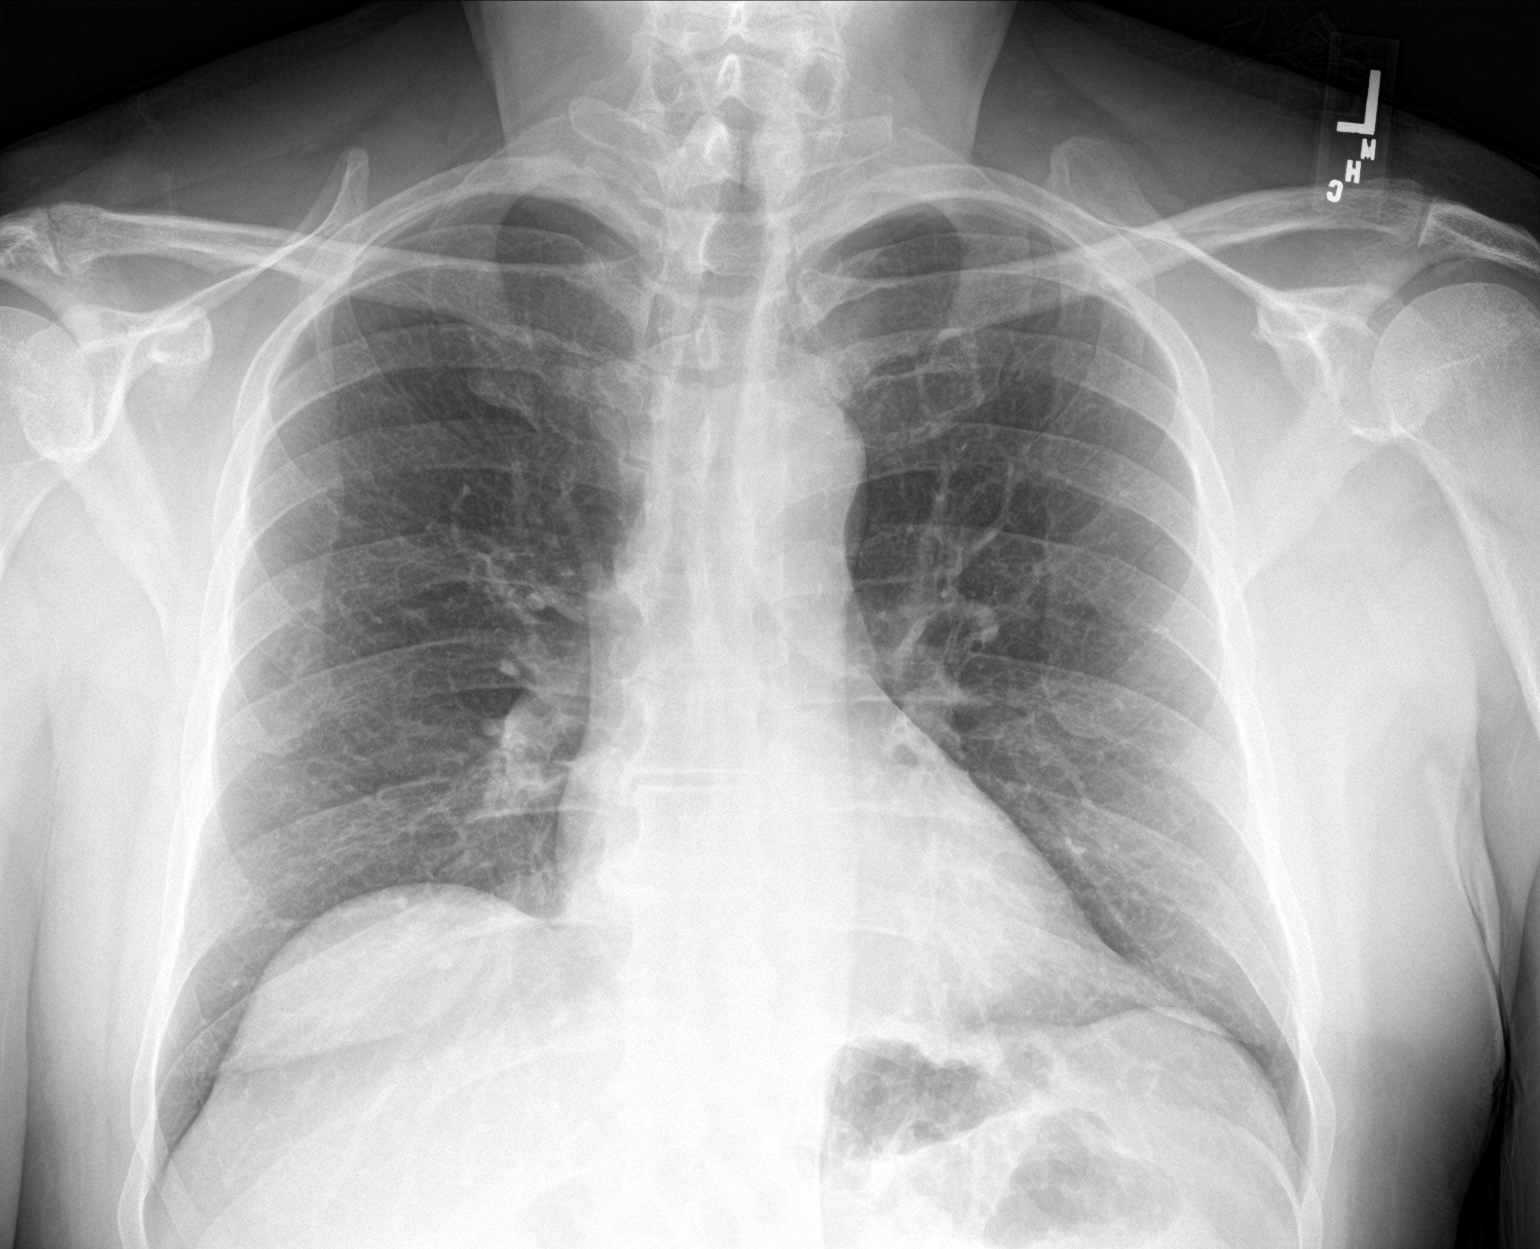

[chest lat]
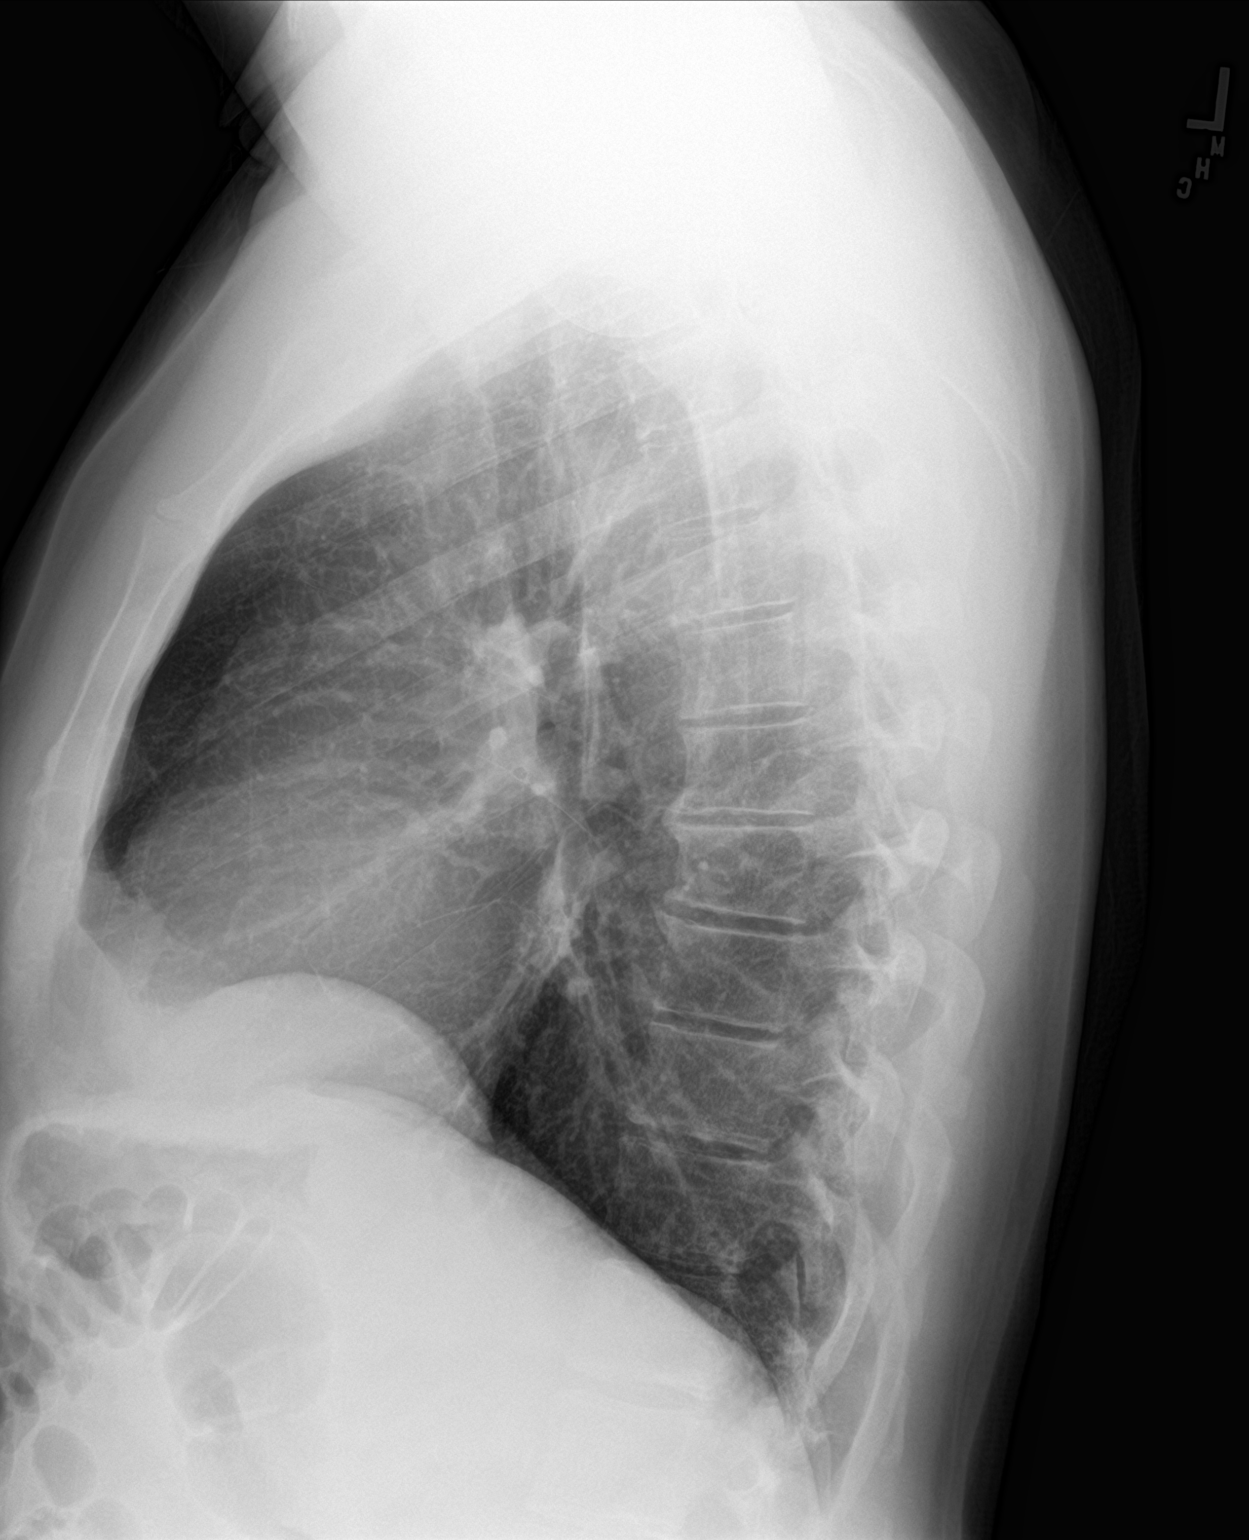

[2 of 2 positions shown; findings below may reference images not displayed]

FINDINGS: Heart size and mediastinal contours are within normal limits. Lungs
are clear. No pleural effusion or pneumothorax is seen. Osseous
structures about the chest are unremarkable.
IMPRESSION: No active cardiopulmonary disease. No evidence of pneumonia or
pulmonary edema.

## 2024-01-04 DIAGNOSIS — H5203 Hypermetropia, bilateral: Secondary | ICD-10-CM | POA: Diagnosis not present

## 2024-01-04 DIAGNOSIS — H524 Presbyopia: Secondary | ICD-10-CM | POA: Diagnosis not present

## 2024-05-29 DIAGNOSIS — L720 Epidermal cyst: Secondary | ICD-10-CM | POA: Diagnosis not present

## 2024-05-29 DIAGNOSIS — L538 Other specified erythematous conditions: Secondary | ICD-10-CM | POA: Diagnosis not present
